# Patient Record
Sex: Male | Born: 2000 | Race: White | Hispanic: No | Marital: Single | Smoking: Never smoker
Health system: Southern US, Community
[De-identification: ages and names within clinical notes are randomized; demographics above are authoritative.]

---
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
## 2018-02-08 DIAGNOSIS — B078 Other viral warts: Secondary | ICD-10-CM | POA: Diagnosis not present

## 2018-02-08 DIAGNOSIS — L905 Scar conditions and fibrosis of skin: Secondary | ICD-10-CM | POA: Diagnosis not present

## 2018-03-22 DIAGNOSIS — J101 Influenza due to other identified influenza virus with other respiratory manifestations: Secondary | ICD-10-CM | POA: Diagnosis not present

## 2018-03-22 DIAGNOSIS — J029 Acute pharyngitis, unspecified: Secondary | ICD-10-CM | POA: Diagnosis not present

## 2018-10-21 DIAGNOSIS — Z20828 Contact with and (suspected) exposure to other viral communicable diseases: Secondary | ICD-10-CM | POA: Diagnosis not present

## 2020-06-26 DIAGNOSIS — R070 Pain in throat: Secondary | ICD-10-CM | POA: Diagnosis not present

## 2022-04-07 DIAGNOSIS — L237 Allergic contact dermatitis due to plants, except food: Secondary | ICD-10-CM | POA: Diagnosis not present

## 2022-07-19 ENCOUNTER — Other Ambulatory Visit: Payer: Self-pay

## 2022-07-19 ENCOUNTER — Emergency Department
Admission: EM | Admit: 2022-07-19 | Discharge: 2022-07-19 | Disposition: A | Discharge status: Home / Self Care | Payer: Commercial Managed Care - PPO | Attending: Emergency Medicine | Admitting: Emergency Medicine

## 2022-07-19 DIAGNOSIS — S51012A Laceration without foreign body of left elbow, initial encounter: Secondary | ICD-10-CM | POA: Insufficient documentation

## 2022-07-19 DIAGNOSIS — Y9389 Activity, other specified: Secondary | ICD-10-CM | POA: Insufficient documentation

## 2022-07-19 DIAGNOSIS — W228XXA Striking against or struck by other objects, initial encounter: Secondary | ICD-10-CM | POA: Insufficient documentation

## 2022-07-19 DIAGNOSIS — Y92814 Boat as the place of occurrence of the external cause: Secondary | ICD-10-CM | POA: Diagnosis not present

## 2022-07-19 DIAGNOSIS — Z23 Encounter for immunization: Secondary | ICD-10-CM | POA: Insufficient documentation

## 2022-07-19 MED ORDER — TETANUS-DIPHTH-ACELL PERTUSSIS 5-2.5-18.5 LF-MCG/0.5 IM SUSY
0.5000 mL | PREFILLED_SYRINGE | Freq: Once | INTRAMUSCULAR | Status: AC
  Administered 2022-07-19: 0.5 mL via INTRAMUSCULAR
  Filled 2022-07-19: qty 0.5

## 2022-07-19 MED ORDER — LIDOCAINE HCL 1 % IJ SOLN
5.0000 mL | Freq: Once | INTRAMUSCULAR | Status: AC
  Administered 2022-07-19: 5 mL
  Filled 2022-07-19: qty 10

## 2022-07-19 MED ORDER — CEPHALEXIN 500 MG PO CAPS: 500.0000 mg | ORAL_CAPSULE | Freq: Four times a day (QID) | ORAL | 0 refills | Status: AC

## 2022-07-19 NOTE — ED Provider Notes (Signed)
[facility] Provider Note  Patient Contact: 10:07 PM (approximate)   History   Laceration (Left Elbow)   HPI  Tommy Tate is a 22 y.o. male presents to the emergency department with a 3 cm left elbow laceration after patient hit it against a piece of metal while patient was working on his boat.  No numbness or tingling in the left upper extremity.  No similar injuries in the past.      Physical Exam   Triage Vital Signs: ED Triage Vitals  Enc Vitals Group     BP 07/19/22 2037 (!) 142/89     Pulse Rate 07/19/22 2037 60     Resp 07/19/22 2037 16     Temp 07/19/22 2037 98 F (36.7 C)     Temp Source 07/19/22 2037 Oral     SpO2 07/19/22 2037 98 %     Weight 07/19/22 2036 195 lb (88.5 kg)     Height 07/19/22 2036 5\' 11"  (1.803 m)     Head Circumference --      Peak Flow --      Pain Score 07/19/22 2036 5     Pain Loc --      Pain Edu? --      Excl. in GC? --     Most recent vital signs: Vitals:   07/19/22 2037  BP: (!) 142/89  Pulse: 60  Resp: 16  Temp: 98 F (36.7 C)  SpO2: 98%     General: Alert and in no acute distress. Eyes:  PERRL. EOMI. Head: No acute traumatic findings ENT:      Nose: No congestion/rhinnorhea.      Mouth/Throat: Mucous membranes are moist. Neck: No stridor. No cervical spine tenderness to palpation. Cardiovascular:  Good peripheral perfusion Respiratory: Normal respiratory effort without tachypnea or retractions. Lungs CTAB. Good air entry to the bases with no decreased or absent breath sounds. Gastrointestinal: Bowel sounds 4 quadrants. Soft and nontender to palpation. No guarding or rigidity. No palpable masses. No distention. No CVA tenderness. Musculoskeletal: Full range of motion to all extremities.  Neurologic:  No gross focal neurologic deficits are appreciated.  Skin: Patient has 3 cm left elbow laceration.    ED Results / Procedures / Treatments   Labs (all labs ordered are listed, but  only abnormal results are displayed) Labs Reviewed - No data to display      PROCEDURES:  Critical Care performed: No  ..Laceration Repair  Date/Time: 07/19/2022 10:08 PM  Performed by: Orvil Feil, PA-C Authorized by: Orvil Feil, PA-C   Consent:    Consent obtained:  Verbal   Risks discussed:  Infection and pain Universal protocol:    Procedure explained and questions answered to patient or proxy's satisfaction: yes     Patient identity confirmed:  Verbally with patient Anesthesia:    Anesthesia method:  Local infiltration   Local anesthetic:  Lidocaine 1% w/o epi Laceration details:    Location:  Shoulder/arm   Shoulder/arm location:  L elbow   Length (cm):  3   Depth (mm):  5 Pre-procedure details:    Preparation:  Patient was prepped and draped in usual sterile fashion Exploration:    Limited defect created (wound extended): no     Contaminated: yes   Treatment:    Area cleansed with:  Povidone-iodine   Amount of cleaning:  Standard   Visualized foreign bodies/material removed: no     Debridement:  None Skin repair:  Repair method:  Sutures   Suture size:  4-0   Suture technique:  Running locked   Number of sutures:  5 Approximation:    Approximation:  Close Repair type:    Repair type:  Simple Post-procedure details:    Dressing:  Non-adherent dressing    MEDICATIONS ORDERED IN ED: Medications  lidocaine (XYLOCAINE) 1 % (with pres) injection 5 mL (5 mLs Infiltration Given 07/19/22 2147)     IMPRESSION / MDM / ASSESSMENT AND PLAN / ED COURSE  I reviewed the triage vital signs and the nursing notes.                              Assessment and plan Elbow laceration 22 year old male presents to the emergency department with a 3 cm left elbow laceration sustained after patient was working on his boat.  Vital signs were reassuring at triage.  On exam, patient was alert and nontoxic-appearing.  Patient underwent laceration repair without  complication and I recommended suture removal in 10 days.  Patient was discharged with Keflex.  Return precautions were given to return with new or worsening symptoms.     FINAL CLINICAL IMPRESSION(S) / ED DIAGNOSES   Final diagnoses:  Laceration of left elbow, initial encounter     Rx / DC Orders   ED Discharge Orders          Ordered    cephALEXin (KEFLEX) 500 MG capsule  4 times daily        07/19/22 2204             Note:  This document was prepared using Dragon voice recognition software and may include unintentional dictation errors.   Pia Mau [place], PA-C 07/19/22 2210    Willy Eddy, MD 07/22/22 1041

## 2022-07-19 NOTE — Discharge Instructions (Signed)
Take Keflex four times daily for the next seven days. Have sutures removed in seven days.  If you notice any redness or streaking surrounding wound site, please return for reevaluation.

## 2022-07-19 NOTE — ED Triage Notes (Signed)
Pt to ED from home for left elbow laceration posterior side. Pt has approc 2 inch lac to back of left elbow. Bandaged with ab pad and coban to control bleeding. Pt is CAOx4, in no acute distress and ambulatory in triage.

## 2023-03-10 DIAGNOSIS — L910 Hypertrophic scar: Secondary | ICD-10-CM | POA: Diagnosis not present

## 2023-03-10 DIAGNOSIS — D2239 Melanocytic nevi of other parts of face: Secondary | ICD-10-CM | POA: Diagnosis not present
